# Patient Record
Sex: Male | Born: 1970 | Race: Black or African American | Hispanic: No | Marital: Single | Smoking: Former smoker
Health system: Southern US, Community
[De-identification: ages and names within clinical notes are randomized; demographics above are authoritative.]

---

## 2013-04-17 ENCOUNTER — Emergency Department (HOSPITAL_COMMUNITY)
Admission: EM | Admit: 2013-04-17 | Discharge: 2013-04-17 | Disposition: A | Discharge status: Home / Self Care | Payer: Self-pay | Attending: Emergency Medicine | Admitting: Emergency Medicine

## 2013-04-17 ENCOUNTER — Encounter (HOSPITAL_COMMUNITY): Payer: Self-pay | Admitting: Emergency Medicine

## 2013-04-17 DIAGNOSIS — S335XXA Sprain of ligaments of lumbar spine, initial encounter: Secondary | ICD-10-CM | POA: Insufficient documentation

## 2013-04-17 DIAGNOSIS — Y939 Activity, unspecified: Secondary | ICD-10-CM | POA: Insufficient documentation

## 2013-04-17 DIAGNOSIS — T148XXA Other injury of unspecified body region, initial encounter: Secondary | ICD-10-CM

## 2013-04-17 DIAGNOSIS — W010XXA Fall on same level from slipping, tripping and stumbling without subsequent striking against object, initial encounter: Secondary | ICD-10-CM | POA: Insufficient documentation

## 2013-04-17 DIAGNOSIS — M545 Low back pain, unspecified: Secondary | ICD-10-CM

## 2013-04-17 DIAGNOSIS — X500XXA Overexertion from strenuous movement or load, initial encounter: Secondary | ICD-10-CM | POA: Insufficient documentation

## 2013-04-17 DIAGNOSIS — Y990 Civilian activity done for income or pay: Secondary | ICD-10-CM | POA: Insufficient documentation

## 2013-04-17 DIAGNOSIS — Y929 Unspecified place or not applicable: Secondary | ICD-10-CM | POA: Insufficient documentation

## 2013-04-17 MED ORDER — DIAZEPAM 5 MG PO TABS: 5.0000 mg | ORAL_TABLET | Freq: Two times a day (BID) | ORAL | Status: AC

## 2013-04-17 MED ORDER — IBUPROFEN 800 MG PO TABS
800.0000 mg | ORAL_TABLET | Freq: Once | ORAL | Status: AC
  Administered 2013-04-17: 800 mg via ORAL
  Filled 2013-04-17: qty 1

## 2013-04-17 MED ORDER — IBUPROFEN 800 MG PO TABS: 800.0000 mg | ORAL_TABLET | Freq: Three times a day (TID) | ORAL | Status: DC

## 2013-04-17 NOTE — Discharge Instructions (Signed)
Lumbosacral Strain Lumbosacral strain is a strain of any of the parts that make up your lumbosacral vertebrae. Your lumbosacral vertebrae are the bones that make up the lower third of your backbone. Your lumbosacral vertebrae are held together by muscles and tough, fibrous tissue (ligaments).  CAUSES  A sudden blow to your back can cause lumbosacral strain. Also, anything that causes an excessive stretch of the muscles in the low back can cause this strain. This is typically seen when people exert themselves strenuously, fall, lift heavy objects, bend, or crouch repeatedly. RISK FACTORS  Physically demanding work.  Participation in pushing or pulling sports or sports that require sudden twist of the back (tennis, golf, baseball).  Weight lifting.  Excessive lower back curvature.  Forward-tilted pelvis.  Weak back or abdominal muscles or both.  Tight hamstrings. SIGNS AND SYMPTOMS  Lumbosacral strain may cause pain in the area of your injury or pain that moves (radiates) down your leg.  DIAGNOSIS Your health care provider can often diagnose lumbosacral strain through a physical exam. In some cases, you may need tests such as X-ray exams.  TREATMENT  Treatment for your lower back injury depends on many factors that your clinician will have to evaluate. However, most treatment will include the use of anti-inflammatory medicines. HOME CARE INSTRUCTIONS   Avoid hard physical activities (tennis, racquetball, waterskiing) if you are not in proper physical condition for it. This may aggravate or create problems.  If you have a back problem, avoid sports requiring sudden body movements. Swimming and walking are generally safer activities.  Maintain good posture.  Maintain a healthy weight.  For acute conditions, you may put ice on the injured area.  Put ice in a plastic bag.  Place a towel between your skin and the bag.  Leave the ice on for 20 minutes, 2 3 times a day.  When the  low back starts healing, stretching and strengthening exercises may be recommended. SEEK MEDICAL CARE IF:  Your back pain is getting worse.  You experience severe back pain not relieved with medicines. SEEK IMMEDIATE MEDICAL CARE IF:   You have numbness, tingling, weakness, or problems with the use of your arms or legs.  There is a change in bowel or bladder control.  You have increasing pain in any area of the body, including your belly (abdomen).  You notice shortness of breath, dizziness, or feel faint.  You feel sick to your stomach (nauseous), are throwing up (vomiting), or become sweaty.  You notice discoloration of your toes or legs, or your feet get very cold. MAKE SURE YOU:   Understand these instructions.  Will watch your condition.  Will get help right away if you are not doing well or get worse. Document Released: 11/22/2004 Document Revised: 12/03/2012 Document Reviewed: 10/01/2012 [employer] Patient Information 2014 [employer].  

## 2013-04-17 NOTE — ED Provider Notes (Signed)
Medical screening examination/treatment/procedure(s) were performed by non-physician practitioner and as supervising physician I was immediately available for consultation/collaboration.  EKG Interpretation   None         Mihailo Sage W. Shikha Bibb, MD 04/17/13 0757 

## 2013-04-17 NOTE — ED Provider Notes (Signed)
CSN: [id number]     Arrival date & time 04/17/13  0704 History   First MD Initiated Contact with Patient 04/17/13 [phone]     No chief complaint on file.    (Consider location/radiation/quality/duration/timing/severity/associated sxs/prior Treatment) Patient is a 43 y.o. male presenting with back pain. The history is provided by the patient. No language interpreter was used.  Back Pain Associated symptoms: no fever and no numbness     43 year old male presents complaining of back pain. Patient reports 2 days ago he slipped on ice and almost fell. At that time he developed an acute onset of sharp pain to his left low back. Pain was initially mild, nonradiating, and he was able to walk without difficulty. The next day he noticed increasing pain to the same location. Pain is worsened with sitting, laying on the affected side and improves with walking. Pain is keeping him up at night. No specific treatment tried. No complaints of fever, rash, urinary or bowel incontinence, saddle anesthesia, or rash. No numbness or weakness. No history of chronic back pain. No abd pain, no dysuria, hematuria, no history of kidney stone. No history of IV drug use, or cancer.  No past medical history on file. No past surgical history on file. No family history on file. History  Substance Use Topics  . Smoking status: Not on file  . Smokeless tobacco: Not on file  . Alcohol Use: Not on file    Review of Systems  Constitutional: Negative for fever.  Musculoskeletal: Positive for back pain.  Neurological: Negative for numbness.      Allergies  Review of patient's allergies indicates not on file.  Home Medications  No current outpatient prescriptions on file. BP 122/65  Pulse 60  Temp(Src) 97.5 F (36.4 C) (Oral)  Resp 16  SpO2 100% Physical Exam  Constitutional: He appears well-developed and well-nourished. No distress.  HENT:  Head: Atraumatic.  Eyes: Conjunctivae are normal.  Neck: Normal range  of motion. Neck supple.  Cardiovascular: Intact distal pulses.   Abdominal: Soft. There is no tenderness.  Genitourinary:  No CVA tenderness  Musculoskeletal: He exhibits tenderness (tenderness to left paralumbar spine worsening with flexion, and rotation. Normal straight leg raise. No overlying skin changes.). He exhibits no edema.  Neurological: He is alert. He has normal reflexes.  Skin: No rash noted.  Psychiatric: He has a normal mood and affect.    ED Course  Procedures (including critical care time)  Patient here with left paralumbar pain likely musculoskeletal in origin as pain is reproducible with movement. No red flags. Low suspicion for kidney stone, caudal equina, or sciatica. Do not think advance imaging necessary as no specific trauma.  I did discuss option of obtaining xray, pt declined, doesn't think he broken any bones. Will treat symptoms with nonsteroidal anti-inflammatory medication, and muscle relaxant. Orthopedic referral as needed. Strict return precautions given. Patient otherwise afebrile with stable normal vital signs. Patient able to ambulate  Labs Review Labs Reviewed - No data to display Imaging Review No results found.  EKG Interpretation   None       MDM   Final diagnoses:  Low back pain  Muscle strain    BP 122/65  Pulse 60  Temp(Src) 97.5 F (36.4 C) (Oral)  Resp 16  Ht 6\' 4"  (1.93 m)  Wt 195 lb (88.451 kg)  BMI 23.75 kg/m2  SpO2 100%     Fayrene HelperBowie Brandilynn Taormina, PA-C 04/17/13 [phone]

## 2013-04-17 NOTE — ED Notes (Signed)
PA Bowie at bedside  °

## 2013-04-17 NOTE — ED Notes (Signed)
Pt presents via POV from home with c/o of lower back pain.  Pt states 2 days ago he slipped at work on the ice and caught his self by turning awkward and injuring his back.

## 2013-11-09 ENCOUNTER — Encounter (HOSPITAL_COMMUNITY): Payer: Self-pay | Admitting: Emergency Medicine

## 2013-11-09 ENCOUNTER — Emergency Department (HOSPITAL_COMMUNITY)
Admission: EM | Admit: 2013-11-09 | Discharge: 2013-11-09 | Disposition: A | Discharge status: Home / Self Care | Payer: Self-pay | Attending: Emergency Medicine | Admitting: Emergency Medicine

## 2013-11-09 DIAGNOSIS — Z791 Long term (current) use of non-steroidal anti-inflammatories (NSAID): Secondary | ICD-10-CM | POA: Insufficient documentation

## 2013-11-09 DIAGNOSIS — Z202 Contact with and (suspected) exposure to infections with a predominantly sexual mode of transmission: Secondary | ICD-10-CM | POA: Insufficient documentation

## 2013-11-09 DIAGNOSIS — Z79899 Other long term (current) drug therapy: Secondary | ICD-10-CM | POA: Insufficient documentation

## 2013-11-09 DIAGNOSIS — R369 Urethral discharge, unspecified: Secondary | ICD-10-CM | POA: Insufficient documentation

## 2013-11-09 DIAGNOSIS — F17200 Nicotine dependence, unspecified, uncomplicated: Secondary | ICD-10-CM | POA: Insufficient documentation

## 2013-11-09 LAB — URINALYSIS, ROUTINE W REFLEX MICROSCOPIC
BILIRUBIN URINE: NEGATIVE
Glucose, UA: NEGATIVE mg/dL
KETONES UR: NEGATIVE mg/dL
NITRITE: NEGATIVE
PH: 6.5 (ref 5.0–8.0)
Protein, ur: NEGATIVE mg/dL
Specific Gravity, Urine: 1.012 (ref 1.005–1.030)
Urobilinogen, UA: 0.2 mg/dL (ref 0.0–1.0)

## 2013-11-09 LAB — URINE MICROSCOPIC-ADD ON

## 2013-11-09 MED ORDER — METRONIDAZOLE 500 MG PO TABS: 500.0000 mg | ORAL_TABLET | Freq: Two times a day (BID) | ORAL | Status: DC

## 2013-11-09 MED ORDER — CEFTRIAXONE SODIUM 250 MG IJ SOLR
250.0000 mg | Freq: Once | INTRAMUSCULAR | Status: AC
  Administered 2013-11-09: 250 mg via INTRAMUSCULAR
  Filled 2013-11-09: qty 250

## 2013-11-09 MED ORDER — AZITHROMYCIN 250 MG PO TABS
1000.0000 mg | ORAL_TABLET | Freq: Once | ORAL | Status: AC
  Administered 2013-11-09: 1000 mg via ORAL
  Filled 2013-11-09: qty 4

## 2013-11-09 NOTE — ED Notes (Signed)
Patient states his friend told him she had "trich" and that he needed to come get tested.

## 2013-11-09 NOTE — ED Provider Notes (Signed)
Medical screening examination/treatment/procedure(s) were performed by non-physician practitioner and as supervising physician I was immediately available for consultation/collaboration.  Kathya Wilz, MD 11/09/13 1607 

## 2013-11-09 NOTE — Discharge Instructions (Signed)
Take Flagyl twice daily for 7 days for the exposure to trichomonas. You were treated today for both gonorrhea and chlamydia. If these tests result positive, you will be contacted and are obligated to inform your partner. Trichomoniasis Trichomoniasis is an infection caused by an organism called Trichomonas. The infection can affect both women and men. In women, the outer male genitalia and the vagina are affected. In men, the penis is mainly affected, but the prostate and other reproductive organs can also be involved. Trichomoniasis is a sexually transmitted infection (STI) and is most often passed to another person through sexual contact.  RISK FACTORS  Having unprotected sexual intercourse.  Having sexual intercourse with an infected partner. SIGNS AND SYMPTOMS  Symptoms of trichomoniasis in women include:  Abnormal gray-green frothy vaginal discharge.  Itching and irritation of the vagina.  Itching and irritation of the area outside the vagina. Symptoms of trichomoniasis in men include:   Penile discharge with or without pain.  Pain during urination. This results from inflammation of the urethra. DIAGNOSIS  Trichomoniasis may be found during a Pap test or physical exam. Your health care provider may use one of the following methods to help diagnose this infection:  Examining vaginal discharge under a microscope. For men, urethral discharge would be examined.  Testing the pH of the vagina with a test tape.  Using a vaginal swab test that checks for the Trichomonas organism. A test is available that provides results within a few minutes.  Doing a culture test for the organism. This is not usually needed. TREATMENT   You may be given medicine to fight the infection. Women should inform their health care provider if they could be or are pregnant. Some medicines used to treat the infection should not be taken during pregnancy.  Your health care provider may recommend  over-the-counter medicines or creams to decrease itching or irritation.  Your sexual partner will need to be treated if infected. HOME CARE INSTRUCTIONS   Take medicines only as directed by your health care provider.  Take over-the-counter medicine for itching or irritation as directed by your health care provider.  Do not have sexual intercourse while you have the infection.  Women should not douche or wear tampons while they have the infection.  Discuss your infection with your partner. Your partner may have gotten the infection from you, or you may have gotten it from your partner.  Have your sex partner get examined and treated if necessary.  Practice safe, informed, and protected sex.  See your health care provider for other STI testing. SEEK MEDICAL CARE IF:   You still have symptoms after you finish your medicine.  You develop abdominal pain.  You have pain when you urinate.  You have bleeding after sexual intercourse.  You develop a rash.  Your medicine makes you sick or makes you throw up (vomit). MAKE SURE YOU:  Understand these instructions.  Will watch your condition.  Will get help right away if you are not doing well or get worse. Document Released: 08/08/2000 Document Revised: 06/29/2013 Document Reviewed: 11/24/2012 [facility] Patient Information 2015 [place], [place]. This information is not intended to replace advice given to you by your health care provider. Make sure you discuss any questions you have with your health care provider. Sexually Transmitted Disease A sexually transmitted disease (STD) is a disease or infection that may be passed (transmitted) from person to person, usually during sexual activity. This may happen by way of saliva, semen, blood, vaginal mucus,  or urine. Common STDs include:   Gonorrhea.   Chlamydia.   Syphilis.   HIV and AIDS.   Genital herpes.   Hepatitis B and C.   Trichomonas.   Human papillomavirus  (HPV).   Pubic lice.   Scabies.  Mites.  Bacterial vaginosis. WHAT ARE CAUSES OF STDs? An STD may be caused by bacteria, a virus, or parasites. STDs are often transmitted during sexual activity if one person is infected. However, they may also be transmitted through nonsexual means. STDs may be transmitted after:   Sexual intercourse with an infected person.   Sharing sex toys with an infected person.   Sharing needles with an infected person or using unclean piercing or tattoo needles.  Having intimate contact with the genitals, mouth, or rectal areas of an infected person.   Exposure to infected fluids during birth. WHAT ARE THE SIGNS AND SYMPTOMS OF STDs? Different STDs have different symptoms. Some people may not have any symptoms. If symptoms are present, they may include:   Painful or bloody urination.   Pain in the pelvis, abdomen, vagina, anus, throat, or eyes.   A skin rash, itching, or irritation.  Growths, ulcerations, blisters, or sores in the genital and anal areas.  Abnormal vaginal discharge with or without bad odor.   Penile discharge in men.   Fever.   Pain or bleeding during sexual intercourse.   Swollen glands in the groin area.   Yellow skin and eyes (jaundice). This is seen with hepatitis.   Swollen testicles.  Infertility.  Sores and blisters in the mouth. HOW ARE STDs DIAGNOSED? To make a diagnosis, your health care provider may:   Take a medical history.   Perform a physical exam.   Take a sample of any discharge to examine.  Swab the throat, cervix, opening to the penis, rectum, or vagina for testing.  Test a sample of your first morning urine.   Perform blood tests.   Perform a Pap test, if this applies.   Perform a colposcopy.   Perform a laparoscopy.  HOW ARE STDs TREATED? Treatment depends on the STD. Some STDs may be treated but not cured.   Chlamydia, gonorrhea, trichomonas, and syphilis can be  cured with antibiotic medicine.   Genital herpes, hepatitis, and HIV can be treated, but not cured, with prescribed medicines. The medicines lessen symptoms.   Genital warts from HPV can be treated with medicine or by freezing, burning (electrocautery), or surgery. Warts may come back.   HPV cannot be cured with medicine or surgery. However, abnormal areas may be removed from the cervix, vagina, or vulva.   If your diagnosis is confirmed, your recent sexual partners need treatment. This is true even if they are symptom-free or have a negative culture or evaluation. They should not have sex until their health care providers say it is okay. HOW CAN I REDUCE MY RISK OF GETTING AN STD? Take these steps to reduce your risk of getting an STD:  Use latex condoms, dental dams, and water-soluble lubricants during sexual activity. Do not use petroleum jelly or oils.  Avoid having multiple sex partners.  Do not have sex with someone who has other sex partners.  Do not have sex with anyone you do not know or who is at high risk for an STD.  Avoid risky sex practices that can break your skin.  Do not have sex if you have open sores on your mouth or skin.  Avoid drinking too much alcohol  or taking illegal drugs. Alcohol and drugs can affect your judgment and put you in a vulnerable position.  Avoid engaging in oral and anal sex acts.  Get vaccinated for HPV and hepatitis. If you have not received these vaccines in the past, talk to your health care provider about whether one or both might be right for you.   If you are at risk of being infected with HIV, it is recommended that you take a prescription medicine daily to prevent HIV infection. This is called pre-exposure prophylaxis (PrEP). You are considered at risk if:  You are a man who has sex with other men (MSM).  You are a heterosexual man or woman and are sexually active with more than one partner.  You take drugs by injection.  You  are sexually active with a partner who has HIV.  Talk with your health care provider about whether you are at high risk of being infected with HIV. If you choose to begin PrEP, you should first be tested for HIV. You should then be tested every 3 months for as long as you are taking PrEP.  WHAT SHOULD I DO IF I THINK I HAVE AN STD?  See your health care provider.   Tell your sexual partner(s). They should be tested and treated for any STDs.  Do not have sex until your health care provider says it is okay. WHEN SHOULD I GET IMMEDIATE MEDICAL CARE? Contact your health care provider right away if:   You have severe abdominal pain.  You are a man and notice swelling or pain in your testicles.  You are a woman and notice swelling or pain in your vagina. Document Released: 05/05/2002 Document Revised: 02/17/2013 Document Reviewed: 09/02/2012 [facility] Patient Information 2015 [place], [place]. This information is not intended to replace advice given to you by your health care provider. Make sure you discuss any questions you have with your health care provider.

## 2013-11-09 NOTE — ED Provider Notes (Signed)
CSN: [id number]     Arrival date & time 11/09/13  4098 History   First MD Initiated Contact with Patient 11/09/13 0818     Chief Complaint  Patient presents with  . Exposure to STD     (Consider location/radiation/quality/duration/timing/severity/associated sxs/prior Treatment) HPI Comments: Pt is a 43 y/o male who presents to the ED for STD testing and treatment. States he was exposed to "trich". Admits to being sexually active with one partner and does not use protection. Denies abdominal pain, n/v, fever, chills, penile pain or discharge, testicular pain or swelling.  Patient is a 43 y.o. male presenting with STD exposure. The history is provided by the patient.  Exposure to STD    History reviewed. No pertinent past medical history. History reviewed. No pertinent past surgical history. No family history on file. History  Substance Use Topics  . Smoking status: Current Some Day Smoker    Types: Cigars  . Smokeless tobacco: Never Used  . Alcohol Use: No    Review of Systems  Constitutional: Negative.   Genitourinary: Negative.   All other systems reviewed and are negative.     Allergies  Review of patient's allergies indicates no known allergies.  Home Medications   Prior to Admission medications   Medication Sig Start Date End Date Taking? Authorizing Provider  diazepam (VALIUM) 5 MG tablet Take 1 tablet (5 mg total) by mouth 2 (two) times daily. 04/17/13   Fayrene Helper, PA-C  ibuprofen (ADVIL,MOTRIN) 800 MG tablet Take 1 tablet (800 mg total) by mouth 3 (three) times daily. 04/17/13   Fayrene Helper, PA-C  metroNIDAZOLE (FLAGYL) 500 MG tablet Take 1 tablet (500 mg total) by mouth 2 (two) times daily. One po bid x 7 days 11/09/13   Trevor Mace, PA-C   BP 148/80  Pulse 52  Temp(Src) 97.6 F (36.4 C) (Oral)  Resp 16  Ht  (1.93 m)  Wt 200 lb (90.719 kg)  BMI 24.35 kg/m2  SpO2 100% Physical Exam  Nursing note and vitals reviewed. Constitutional: He is oriented  to person, place, and time. He appears well-developed and well-nourished. No distress.  HENT:  Head: Normocephalic and atraumatic.  Mouth/Throat: Oropharynx is clear and moist.  Eyes: Conjunctivae are normal.  Neck: Normal range of motion. Neck supple.  Cardiovascular: Normal rate, regular rhythm and normal heart sounds.   Pulmonary/Chest: Effort normal and breath sounds normal.  Abdominal: Soft. Bowel sounds are normal. There is no tenderness.  Genitourinary: Right testis shows no mass, no swelling and no tenderness. Left testis shows no mass, no swelling and no tenderness. Circumcised. No penile erythema or penile tenderness. Discharge (small amount of clear discharge) found.  Musculoskeletal: Normal range of motion. He exhibits no edema.  Neurological: He is alert and oriented to person, place, and time.  Skin: Skin is warm and dry. He is not diaphoretic.  Psychiatric: He has a normal mood and affect. His behavior is normal.    ED Course  Procedures (including critical care time) Labs Review Labs Reviewed  GC/CHLAMYDIA PROBE AMP  URINALYSIS, ROUTINE W REFLEX MICROSCOPIC    Imaging Review No results found.   EKG Interpretation None      MDM   Final diagnoses:  Exposure to STD   C/Chlamydia cultures pending. UA obtained. Prophylactic treatment with rocephin, azithromycin and flagyl. Safe sexual practices discussed. Stable for d/c.Return precautions given. Patient states understanding of treatment care plan and is agreeable.  Trevor Mace, PA-C 11/09/13 [phone]

## 2013-11-10 LAB — GC/CHLAMYDIA PROBE AMP
CT Probe RNA: POSITIVE — AB
GC Probe RNA: NEGATIVE

## 2013-11-11 ENCOUNTER — Telehealth (HOSPITAL_BASED_OUTPATIENT_CLINIC_OR_DEPARTMENT_OTHER): Payer: Self-pay | Admitting: Emergency Medicine

## 2013-11-11 NOTE — Telephone Encounter (Signed)
Positive Chlamydia Treated with Zithromax and Rocephin per protocol MD [facility] faxed  ID verified, Patient notified of positive Chlamydia and that treatment was given in ED with Rocephin and Ztihromax. STD instructions provided - patient verbalized understanding.

## 2013-11-12 ENCOUNTER — Telehealth (HOSPITAL_COMMUNITY): Payer: Self-pay

## 2013-11-24 ENCOUNTER — Emergency Department: Payer: Self-pay | Admitting: Emergency Medicine

## 2014-06-25 ENCOUNTER — Emergency Department (HOSPITAL_COMMUNITY)
Admission: EM | Admit: 2014-06-25 | Discharge: 2014-06-25 | Disposition: A | Discharge status: Home / Self Care | Payer: Self-pay | Attending: Emergency Medicine | Admitting: Emergency Medicine

## 2014-06-25 ENCOUNTER — Encounter (HOSPITAL_COMMUNITY): Payer: Self-pay | Admitting: Emergency Medicine

## 2014-06-25 DIAGNOSIS — T148XXA Other injury of unspecified body region, initial encounter: Secondary | ICD-10-CM

## 2014-06-25 DIAGNOSIS — Z79899 Other long term (current) drug therapy: Secondary | ICD-10-CM | POA: Insufficient documentation

## 2014-06-25 DIAGNOSIS — Z720 Tobacco use: Secondary | ICD-10-CM | POA: Insufficient documentation

## 2014-06-25 DIAGNOSIS — Y9289 Other specified places as the place of occurrence of the external cause: Secondary | ICD-10-CM | POA: Insufficient documentation

## 2014-06-25 DIAGNOSIS — S4992XA Unspecified injury of left shoulder and upper arm, initial encounter: Secondary | ICD-10-CM | POA: Insufficient documentation

## 2014-06-25 DIAGNOSIS — Z791 Long term (current) use of non-steroidal anti-inflammatories (NSAID): Secondary | ICD-10-CM | POA: Insufficient documentation

## 2014-06-25 DIAGNOSIS — Y9389 Activity, other specified: Secondary | ICD-10-CM | POA: Insufficient documentation

## 2014-06-25 DIAGNOSIS — T148 Other injury of unspecified body region: Secondary | ICD-10-CM | POA: Insufficient documentation

## 2014-06-25 DIAGNOSIS — Y998 Other external cause status: Secondary | ICD-10-CM | POA: Insufficient documentation

## 2014-06-25 DIAGNOSIS — X58XXXA Exposure to other specified factors, initial encounter: Secondary | ICD-10-CM | POA: Insufficient documentation

## 2014-06-25 MED ORDER — IBUPROFEN 800 MG PO TABS: 800.0000 mg | ORAL_TABLET | Freq: Three times a day (TID) | ORAL | Status: AC

## 2014-06-25 MED ORDER — CYCLOBENZAPRINE HCL 10 MG PO TABS: 10.0000 mg | ORAL_TABLET | Freq: Two times a day (BID) | ORAL | Status: AC | PRN

## 2014-06-25 NOTE — ED Provider Notes (Signed)
CSN: [id number]     Arrival date & time 06/25/14  1136 History  This chart was scribed for non-physician practitioner, Teressa Lower, NP, working with Mancel Bale, MD by Charline Bills, ED Scribe. This patient was seen in room TR06C/TR06C and the patient's care was started at 11:52 AM.   Chief Complaint  Patient presents with  . Back Pain  . Shoulder Pain   The history is provided by the patient. No language interpreter was used.   HPI Comments: Jeffrey Curry is a 44 y.o. male, with no pertinent medical history, who presents to the Emergency Department complaining of acute onset of L shoulder pain that radiates into upper back since yesterday. Pt states that he did a lot of heavy lifting in a basement yesterday. He describes pain as soreness and muscle spasms. Pt denies numbness, weakness, h/o back pain. Pt has been treating with ibuprofen and ice.   History reviewed. No pertinent past medical history. History reviewed. No pertinent past surgical history. No family history on file. History  Substance Use Topics  . Smoking status: Current Some Day Smoker    Types: Cigars  . Smokeless tobacco: Never Used  . Alcohol Use: No    Review of Systems  Musculoskeletal: Positive for back pain and arthralgias.  Neurological: Negative for weakness and numbness.  All other systems reviewed and are negative.  Allergies  Review of patient's allergies indicates no known allergies.  Home Medications   Prior to Admission medications   Medication Sig Start Date End Date Taking? Authorizing Provider  diazepam (VALIUM) 5 MG tablet Take 1 tablet (5 mg total) by mouth 2 (two) times daily. 04/17/13   Fayrene Helper, PA-C  ibuprofen (ADVIL,MOTRIN) 800 MG tablet Take 1 tablet (800 mg total) by mouth 3 (three) times daily. 04/17/13   Fayrene Helper, PA-C  metroNIDAZOLE (FLAGYL) 500 MG tablet Take 1 tablet (500 mg total) by mouth 2 (two) times daily. One po bid x 7 days 11/09/13   Robyn M Hess, PA-C   BP 130/82  mmHg  Pulse 58  Temp(Src) 97.7 F (36.5 C) (Oral)  Resp 20  SpO2 100% Physical Exam  Constitutional: He is oriented to person, place, and time. He appears well-developed and well-nourished. No distress.  HENT:  Head: Normocephalic and atraumatic.  Eyes: Conjunctivae and EOM are normal.  Neck: Neck supple. No tracheal deviation present.  Cardiovascular: Normal rate.   Pulmonary/Chest: Effort normal. No respiratory distress.  Musculoskeletal: Normal range of motion.       Arms: Neurological: He is alert and oriented to person, place, and time. He exhibits normal muscle tone. Coordination normal.  Skin: Skin is warm and dry.  Psychiatric: He has a normal mood and affect. His behavior is normal.  Nursing note and vitals reviewed.  ED Course  Procedures (including critical care time) DIAGNOSTIC STUDIES: Oxygen Saturation is 100% on RA, normal by my interpretation.    COORDINATION OF CARE: 11:54 AM-Discussed treatment plan which includes Flexeril and continue ibuprofen with pt at bedside and pt agreed to plan.   Labs Review Labs Reviewed - No data to display  Imaging Review No results found.   EKG Interpretation None      MDM   Final diagnoses:  Muscle strain   Doubt bony injury. Likely muscular. Pt is okay to follow up with ortho as needed  I personally performed the services described in this documentation, which was scribed in my presence. The recorded information has been reviewed and is accurate.  Teressa LowerVrinda Sarahanne Novakowski, NP 06/25/14 1217  Mancel BaleElliott Wentz, MD 06/25/14 [phone]

## 2014-06-25 NOTE — Discharge Instructions (Signed)

## 2014-06-25 NOTE — ED Notes (Addendum)
Pt c/o left mid to upper back pain and left shoulder pain that started yesterday. Pt does a lot of heavy lifting. Pain increases when reaching or moving left arm.

## 2014-09-08 ENCOUNTER — Emergency Department (HOSPITAL_COMMUNITY)
Admission: EM | Admit: 2014-09-08 | Discharge: 2014-09-08 | Disposition: A | Discharge status: Home / Self Care | Payer: Self-pay | Attending: Emergency Medicine | Admitting: Emergency Medicine

## 2014-09-08 ENCOUNTER — Encounter (HOSPITAL_COMMUNITY): Payer: Self-pay | Admitting: *Deleted

## 2014-09-08 DIAGNOSIS — Y9289 Other specified places as the place of occurrence of the external cause: Secondary | ICD-10-CM | POA: Insufficient documentation

## 2014-09-08 DIAGNOSIS — W57XXXA Bitten or stung by nonvenomous insect and other nonvenomous arthropods, initial encounter: Secondary | ICD-10-CM | POA: Insufficient documentation

## 2014-09-08 DIAGNOSIS — Z720 Tobacco use: Secondary | ICD-10-CM | POA: Insufficient documentation

## 2014-09-08 DIAGNOSIS — Z791 Long term (current) use of non-steroidal anti-inflammatories (NSAID): Secondary | ICD-10-CM | POA: Insufficient documentation

## 2014-09-08 DIAGNOSIS — Y998 Other external cause status: Secondary | ICD-10-CM | POA: Insufficient documentation

## 2014-09-08 DIAGNOSIS — Y9389 Activity, other specified: Secondary | ICD-10-CM | POA: Insufficient documentation

## 2014-09-08 DIAGNOSIS — Z79899 Other long term (current) drug therapy: Secondary | ICD-10-CM | POA: Insufficient documentation

## 2014-09-08 DIAGNOSIS — S50861A Insect bite (nonvenomous) of right forearm, initial encounter: Secondary | ICD-10-CM | POA: Insufficient documentation

## 2014-09-08 DIAGNOSIS — L03113 Cellulitis of right upper limb: Secondary | ICD-10-CM | POA: Insufficient documentation

## 2014-09-08 MED ORDER — CEPHALEXIN 500 MG PO CAPS: 500.0000 mg | ORAL_CAPSULE | Freq: Four times a day (QID) | ORAL | Status: DC

## 2014-09-08 NOTE — ED Notes (Signed)
Pt reports possible insect bite to right forearm, has red area noted to anterior forearm. No red streaks noted, denies fever.

## 2014-09-08 NOTE — ED Provider Notes (Signed)
CSN: [id number]     Arrival date & time 09/08/14  0903 History  This chart was scribed for non-physician practitioner, Lottie Mussel, PA-C, working with Purvis Sheffield, MD by Charline Bills, ED Scribe. This patient was seen in room TR09C/TR09C and the patient's care was started at 9:25 AM.   Chief Complaint  Patient presents with  . Abscess   The history is provided by the patient. No language interpreter was used.   HPI Comments: Jeffrey Curry is a 44 y.o. male who presents to the Emergency Department complaining of an insect bite to right forearm 2 days ago. Pt suspects that he was stung by a wasp although he did not see the insect. He reports associated gradually worsening swelling, itching and redness. Pt denies pain to the affected area. No treatments tried PTA. No fever or chills  History reviewed. No pertinent past medical history. History reviewed. No pertinent past surgical history. History reviewed. No pertinent family history. History  Substance Use Topics  . Smoking status: Current Some Day Smoker    Types: Cigars  . Smokeless tobacco: Never Used  . Alcohol Use: No    Review of Systems  Constitutional: Negative for fever and chills.  Skin: Positive for color change.       + Bite  Neurological: Negative for headaches.   Allergies  Review of patient's allergies indicates no known allergies.  Home Medications   Prior to Admission medications   Medication Sig Start Date End Date Taking? Authorizing Provider  cyclobenzaprine (FLEXERIL) 10 MG tablet Take 1 tablet (10 mg total) by mouth 2 (two) times daily as needed for muscle spasms. 06/25/14   Teressa Lower, NP  diazepam (VALIUM) 5 MG tablet Take 1 tablet (5 mg total) by mouth 2 (two) times daily. 04/17/13   Fayrene Helper, PA-C  ibuprofen (ADVIL,MOTRIN) 800 MG tablet Take 1 tablet (800 mg total) by mouth 3 (three) times daily. 06/25/14   Teressa Lower, NP  metroNIDAZOLE (FLAGYL) 500 MG tablet Take 1 tablet (500  mg total) by mouth 2 (two) times daily. One po bid x 7 days 11/09/13   Nada Boozer Hess, PA-C   BP 122/68 mmHg  Pulse 71  Temp(Src) 97.7 F (36.5 C) (Oral)  Resp 16  Ht  (1.905 m)  Wt 210 lb (95.255 kg)  BMI 26.25 kg/m2  SpO2 100% Physical Exam  Constitutional: He is oriented to person, place, and time. He appears well-developed and well-nourished. No distress.  HENT:  Head: Normocephalic and atraumatic.  Eyes: Conjunctivae and EOM are normal.  Neck: Neck supple. No tracheal deviation present.  Cardiovascular: Normal rate and intact distal pulses.   Distal radial pulses intact  Pulmonary/Chest: Effort normal. No respiratory distress.  Musculoskeletal: Normal range of motion.  Neurological: He is alert and oriented to person, place, and time.  Skin: Skin is warm and dry.  6 x 4 cm area of erythema to the right anterior forearm with tenderness, induration, warmth to touch. No drainage. No stinger seen. No pustules or vesicles. No fluctuance to palpation.   Psychiatric: He has a normal mood and affect. His behavior is normal.  Nursing note and vitals reviewed.  ED Course  Procedures (including critical care time) DIAGNOSTIC STUDIES: Oxygen Saturation is 100% on RA, normal by my interpretation.    COORDINATION OF CARE: 9:29 AM-Discussed treatment plan which includes Keflex and hydrocortisone cream with pt at bedside and pt agreed to plan.   Labs Review Labs Reviewed - No data  to display  Imaging Review No results found.   EKG Interpretation None      MDM   Final diagnoses:  Right forearm cellulitis  Insect bite of forearm, right, initial encounter    patient with area of erythema and tenderness to the right forearm after being stung by unknown insect. Patient felt being stung but did not see what got him. He now has area of erythema that he says is spreading. Will treat with hydrocortisone cream and with Keflex for possible early cellulitis given tenderness and  spreading of erythema. Patient is afebrile, otherwise nontoxic appearing. Instructed to follow-up and return precautions discussed.    Filed Vitals:   09/08/14 0908  BP: 122/68  Pulse: 71  Temp: 97.7 F (36.5 C)  TempSrc: Oral  Resp: 16  Height: 6\' 3"  (1.905 m)  Weight: 210 lb (95.255 kg)  SpO2: 100%   I personally performed the services described in this documentation, which was scribed in my presence. The recorded information has been reviewed and is accurate.    Jaynie Crumbleatyana Toinette Lackie, PA-C 09/08/14 [id number]  Purvis SheffieldForrest Harrison, MD 09/10/14 [phone]

## 2014-09-08 NOTE — Discharge Instructions (Signed)
Apply hydrocortisone cream topically 3 times a day. Take Keflex as prescribed until all gone. Follow-up as needed. Return if worsening.   Bee, Wasp, or Hornet Sting Your caregiver has diagnosed you as having an insect sting. An insect sting appears as a red lump in the skin that sometimes has a tiny hole in the center, or it may have a stinger in the center of the wound. The most common stings are from wasps, hornets and bees. Individuals have different reactions to insect stings.  A normal reaction may cause pain, swelling, and redness around the sting site.  A localized allergic reaction may cause swelling and redness that extends beyond the sting site.  A large local reaction may continue to develop over the next 12 to 36 hours.  On occasion, the reactions can be severe (anaphylactic reaction). An anaphylactic reaction may cause wheezing; difficulty breathing; chest pain; fainting; raised, itchy, red patches on the skin; a sick feeling to your stomach (nausea); vomiting; cramping; or diarrhea. If you have had an anaphylactic reaction to an insect sting in the past, you are more likely to have one again. HOME CARE INSTRUCTIONS   With bee stings, a small sac of poison is left in the wound. Brushing across this with something such as a credit card, or anything similar, will help remove this and decrease the amount of the reaction. This same procedure will not help a wasp sting as they do not leave behind a stinger and poison sac.  Apply a cold compress for 10 to 20 minutes every hour for 1 to 2 days, depending on severity, to reduce swelling and itching.  To lessen pain, a paste made of water and baking soda may be rubbed on the bite or sting and left on for 5 minutes.  To relieve itching and swelling, you may use take medication or apply medicated creams or lotions as directed.  Only take over-the-counter or prescription medicines for pain, discomfort, or fever as directed by your  caregiver.  Wash the sting site daily with soap and water. Apply antibiotic ointment on the sting site as directed.  If you suffered a severe reaction:  If you did not require hospitalization, an adult will need to stay with you for 24 hours in case the symptoms return.  You may need to wear a medical bracelet or necklace stating the allergy.  You and your family need to learn when and how to use an anaphylaxis kit or epinephrine injection.  If you have had a severe reaction before, always carry your anaphylaxis kit with you. SEEK MEDICAL CARE IF:   None of the above helps within 2 to 3 days.  The area becomes red, warm, tender, and swollen beyond the area of the bite or sting.  You have an oral temperature above 102 F (38.9 C). SEEK IMMEDIATE MEDICAL CARE IF:  You have symptoms of an allergic reaction which are:  Wheezing.  Difficulty breathing.  Chest pain.  Lightheadedness or fainting.  Itchy, raised, red patches on the skin.  Nausea, vomiting, cramping or diarrhea. ANY OF THESE SYMPTOMS MAY REPRESENT A SERIOUS PROBLEM THAT IS AN EMERGENCY. Do not wait to see if the symptoms will go away. Get medical help right away. Call your local emergency services (911 in [place] DO NOT drive yourself to the hospital. MAKE SURE YOU:   Understand these instructions.  Will watch your condition.  Will get help right away if you are not doing well or get worse. Document  Released: 02/12/2005 Document Revised: 05/07/2011 Document Reviewed: 07/30/2009 [facility] Patient Information 2015 [place], [place]. This information is not intended to replace advice given to you by your health care provider. Make sure you discuss any questions you have with your health care provider.

## 2015-04-21 ENCOUNTER — Emergency Department (HOSPITAL_COMMUNITY)
Admission: EM | Admit: 2015-04-21 | Discharge: 2015-04-21 | Disposition: A | Discharge status: Home / Self Care | Payer: Managed Care, Other (non HMO) | Attending: Emergency Medicine | Admitting: Emergency Medicine

## 2015-04-21 ENCOUNTER — Encounter (HOSPITAL_COMMUNITY): Payer: Self-pay | Admitting: Emergency Medicine

## 2015-04-21 ENCOUNTER — Emergency Department (HOSPITAL_COMMUNITY): Payer: Managed Care, Other (non HMO)

## 2015-04-21 DIAGNOSIS — Z792 Long term (current) use of antibiotics: Secondary | ICD-10-CM | POA: Diagnosis not present

## 2015-04-21 DIAGNOSIS — Y92410 Unspecified street and highway as the place of occurrence of the external cause: Secondary | ICD-10-CM | POA: Diagnosis not present

## 2015-04-21 DIAGNOSIS — Y9389 Activity, other specified: Secondary | ICD-10-CM | POA: Insufficient documentation

## 2015-04-21 DIAGNOSIS — S4992XA Unspecified injury of left shoulder and upper arm, initial encounter: Secondary | ICD-10-CM | POA: Diagnosis present

## 2015-04-21 DIAGNOSIS — Z87891 Personal history of nicotine dependence: Secondary | ICD-10-CM | POA: Insufficient documentation

## 2015-04-21 DIAGNOSIS — S199XXA Unspecified injury of neck, initial encounter: Secondary | ICD-10-CM | POA: Diagnosis not present

## 2015-04-21 DIAGNOSIS — Z791 Long term (current) use of non-steroidal anti-inflammatories (NSAID): Secondary | ICD-10-CM | POA: Diagnosis not present

## 2015-04-21 DIAGNOSIS — Y998 Other external cause status: Secondary | ICD-10-CM | POA: Diagnosis not present

## 2015-04-21 DIAGNOSIS — Z79899 Other long term (current) drug therapy: Secondary | ICD-10-CM | POA: Diagnosis not present

## 2015-04-21 DIAGNOSIS — M898X1 Other specified disorders of bone, shoulder: Secondary | ICD-10-CM

## 2015-04-21 MED ORDER — NAPROXEN 500 MG PO TABS: 500.0000 mg | ORAL_TABLET | Freq: Two times a day (BID) | ORAL | Status: AC

## 2015-04-21 MED ORDER — METHOCARBAMOL 500 MG PO TABS: 500.0000 mg | ORAL_TABLET | Freq: Two times a day (BID) | ORAL | Status: AC
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
## 2015-04-21 NOTE — ED Notes (Signed)
Patient able to ambulate independently  
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
## 2015-09-22 ENCOUNTER — Encounter (HOSPITAL_COMMUNITY): Payer: Self-pay | Admitting: *Deleted

## 2015-09-22 ENCOUNTER — Emergency Department (HOSPITAL_COMMUNITY)
Admission: EM | Admit: 2015-09-22 | Discharge: 2015-09-22 | Disposition: A | Discharge status: Home / Self Care | Payer: Managed Care, Other (non HMO) | Attending: Emergency Medicine | Admitting: Emergency Medicine

## 2015-09-22 DIAGNOSIS — S0086XA Insect bite (nonvenomous) of other part of head, initial encounter: Secondary | ICD-10-CM | POA: Insufficient documentation

## 2015-09-22 DIAGNOSIS — Y999 Unspecified external cause status: Secondary | ICD-10-CM | POA: Insufficient documentation

## 2015-09-22 DIAGNOSIS — W57XXXA Bitten or stung by nonvenomous insect and other nonvenomous arthropods, initial encounter: Secondary | ICD-10-CM

## 2015-09-22 DIAGNOSIS — Y929 Unspecified place or not applicable: Secondary | ICD-10-CM | POA: Insufficient documentation

## 2015-09-22 DIAGNOSIS — Y939 Activity, unspecified: Secondary | ICD-10-CM | POA: Insufficient documentation

## 2015-09-22 DIAGNOSIS — Z87891 Personal history of nicotine dependence: Secondary | ICD-10-CM | POA: Insufficient documentation

## 2015-09-22 MED ORDER — PREDNISONE 20 MG PO TABS: ORAL_TABLET | ORAL | 0 refills | Status: AC

## 2015-09-22 MED ORDER — DIPHENHYDRAMINE HCL 25 MG PO CAPS
25.0000 mg | ORAL_CAPSULE | Freq: Four times a day (QID) | ORAL | 0 refills | Status: AC | PRN
Start: 2015-09-22 — End: ?

## 2015-09-22 MED ORDER — DIPHENHYDRAMINE HCL 25 MG PO CAPS
25.0000 mg | ORAL_CAPSULE | Freq: Once | ORAL | Status: AC
  Administered 2015-09-22: 25 mg via ORAL
  Filled 2015-09-22: qty 1

## 2015-09-22 MED ORDER — PREDNISONE 20 MG PO TABS
60.0000 mg | ORAL_TABLET | Freq: Once | ORAL | Status: AC
  Administered 2015-09-22: 60 mg via ORAL
  Filled 2015-09-22: qty 3

## 2015-09-22 NOTE — Discharge Instructions (Signed)
Use Prednisone as directed. Take Benadryl as needed for itching or swelling. Follow up with your doctor if symptoms are not improving after 3-4 days. You may return to the emergency department if symptoms worsen, become progressive, or become more concerning.

## 2015-09-22 NOTE — ED Provider Notes (Signed)
[facility]-EMERGENCY DEPT Provider Note   CSN: [id number] Arrival date & time: 09/22/15  2035  First Provider Contact:  09/22/2015 7:19 PM  By signing my name below, I, Levon Hedger, attest that this documentation has been prepared under the direction and in the presence of non-physician practitioner, Shanna Cisco, PA-C. Electronically Signed: Levon Hedger, Scribe. 09/22/2015. 6:34 PM.   History   Chief Complaint Chief Complaint  Patient presents with  . Insect Bite    HPI Jeffrey Curry is an otherwise healthy 45 y.o. male who presents to the Emergency Department complaining of sudden onset itching to right cheek s/p insect bite four hours ago. Pt states he was stung by a wasp. No medication taken PTA. No alleviating factors noted. He also complains of associated mild swelling. He denies visual changes, trouble breathing, trouble swallowing, or throat/neck swelling.   The history is provided by the patient. No language interpreter was used.    Home Medications    Prior to Admission medications   Medication Sig Start Date End Date Taking? Authorizing Provider  cephALEXin (KEFLEX) 500 MG capsule Take 1 capsule (500 mg total) by mouth 4 (four) times daily. 09/08/14   Tatyana Kirichenko, PA-C  cyclobenzaprine (FLEXERIL) 10 MG tablet Take 1 tablet (10 mg total) by mouth 2 (two) times daily as needed for muscle spasms. 06/25/14   Teressa Lower, NP  diazepam (VALIUM) 5 MG tablet Take 1 tablet (5 mg total) by mouth 2 (two) times daily. 04/17/13   Fayrene Helper, PA-C  diphenhydrAMINE (BENADRYL) 25 mg capsule Take 1 capsule (25 mg total) by mouth every 6 (six) hours as needed (itching or swelling). 09/22/15   Chase Picket Ward, PA-C  ibuprofen (ADVIL,MOTRIN) 800 MG tablet Take 1 tablet (800 mg total) by mouth 3 (three) times daily. 06/25/14   Teressa Lower, NP  methocarbamol (ROBAXIN) 500 MG tablet Take 1 tablet (500 mg total) by mouth 2 (two) times daily. 04/21/15   Felicie Morn, NP  metroNIDAZOLE  (FLAGYL) 500 MG tablet Take 1 tablet (500 mg total) by mouth 2 (two) times daily. One po bid x 7 days 11/09/13   Kathrynn Speed, PA-C  naproxen (NAPROSYN) 500 MG tablet Take 1 tablet (500 mg total) by mouth 2 (two) times daily. 04/21/15   Felicie Morn, NP  predniSONE (DELTASONE) 20 MG tablet Take 2 tablets (40 mg total) by mouth tomorrow morning. Then take 1 tablet (20mg  total) by mouth the next morning. 09/22/15   Chase Picket Ward, PA-C    Family History History reviewed. No pertinent family history.  Social History Social History  Substance Use Topics  . Smoking status: Former Smoker    Types: Cigars  . Smokeless tobacco: Never Used  . Alcohol use No     Allergies   Review of patient's allergies indicates no known allergies.   Review of Systems Review of Systems  HENT: Positive for facial swelling. Negative for trouble swallowing.        Negative throat swelling  Eyes: Negative for visual disturbance.  Respiratory: Negative for shortness of breath.   Skin:       +itching     Physical Exam Updated Vital Signs BP 123/81 (BP Location: Right Arm)   Pulse 61   Temp 97.9 F (36.6 C) (Oral)   Resp 14   SpO2 100%   Physical Exam  Constitutional: He appears well-developed and well-nourished.  HENT:  Head: Normocephalic and atraumatic.    Patent airway with no oral or neck swelling.  Eyes: Conjunctivae and EOM are normal. Pupils are equal, round, and reactive to light. Right eye exhibits no discharge. Left eye exhibits no discharge.  Neck: Normal range of motion. Neck supple.  Cardiovascular: Normal rate and regular rhythm.   No murmur heard. Pulmonary/Chest: Effort normal and breath sounds normal. No respiratory distress. He has no wheezes. He has no rales.  Abdominal: Soft. There is no tenderness.  Musculoskeletal: Normal range of motion.  Lymphadenopathy:    He has no cervical adenopathy.  Neurological: He is alert.  Skin: Skin is warm and dry.  Psychiatric: He has  a normal mood and affect.  Nursing note and vitals reviewed.   ED Treatments / Results  DIAGNOSTIC STUDIES:  Oxygen Saturation is 100% on RA, normal by my interpretation.    COORDINATION OF CARE:  6:30 PM Will order prednisone and benadryl. Discussed treatment plan with pt at bedside and pt agreed to plan.  Labs (all labs ordered are listed, but only abnormal results are displayed) Labs Reviewed - No data to display  EKG  EKG Interpretation None       Radiology No results found.  Procedures Procedures (including critical care time)  Medications Ordered in ED Medications  predniSONE (DELTASONE) tablet 60 mg (60 mg Oral Given 09/22/15 1906)  diphenhydrAMINE (BENADRYL) capsule 25 mg (25 mg Oral Given 09/22/15 1906)     Initial Impression / Assessment and Plan / ED Course  I have reviewed the triage vital signs and the nursing notes.  Pertinent labs & imaging results that were available during my care of the patient were reviewed by me and considered in my medical decision making (see chart for details).  Clinical Course   Jeffrey Curry presents to ED for insect bite just PTA. On exam, patient with insect bite and associated swelling of the right maxillary region. Patent airway and his lungs are clear. No complaints of shortness of breath or difficulty swallowing. Will give prednisone and Benadryl then reassess.  7:37 PM - Patient reevaluated and subjectively feels much improved. Objectively, swelling decreased. Patient appears well.  Evaluation does not show pathology that would require ongoing emergent intervention or inpatient treatment. Patient is hemodynamically stable and mentating appropriately. Will dc with 2 days steroids and Benadryl PRN itching. Return precautions discussed and all questions answered.   Final Clinical Impressions(s) / ED Diagnoses   Final diagnoses:  Insect bite  I personally performed the services described in this documentation, which was  scribed in my presence. The recorded information has been reviewed and is accurate.  New Prescriptions New Prescriptions   DIPHENHYDRAMINE (BENADRYL) 25 MG CAPSULE    Take 1 capsule (25 mg total) by mouth every 6 (six) hours as needed (itching or swelling).   PREDNISONE (DELTASONE) 20 MG TABLET    Take 2 tablets (40 mg total) by mouth tomorrow morning. Then take 1 tablet (  total) by mouth the next morning.     [facility] Ward, PA-C 09/22/15 1947    Nelva Nay, MD 09/29/15 [phone]

## 2015-09-22 NOTE — ED Triage Notes (Signed)
Pt reports being stung by bee x 2 this afternoon to right side of face. Reports swelling to right side of face and right eyelid, minimal swelling noted, airway intact. Has not taken any benadryl or other meds pta.

## 2015-09-22 NOTE — ED Notes (Signed)
Patient able to ambulate independently  

## 2016-10-05 ENCOUNTER — Encounter (HOSPITAL_COMMUNITY): Payer: Self-pay

## 2016-10-05 ENCOUNTER — Emergency Department (HOSPITAL_COMMUNITY)
Admission: EM | Admit: 2016-10-05 | Discharge: 2016-10-05 | Discharge status: Against Medical Advice | Payer: Managed Care, Other (non HMO) | Attending: Emergency Medicine | Admitting: Emergency Medicine

## 2016-10-05 DIAGNOSIS — Z5321 Procedure and treatment not carried out due to patient leaving prior to being seen by health care provider: Secondary | ICD-10-CM | POA: Insufficient documentation

## 2016-10-05 LAB — COMPREHENSIVE METABOLIC PANEL
ALBUMIN: 4.3 g/dL (ref 3.5–5.0)
ALT: 12 U/L — ABNORMAL LOW (ref 17–63)
AST: 16 U/L (ref 15–41)
Alkaline Phosphatase: 51 U/L (ref 38–126)
Anion gap: 6 (ref 5–15)
BUN: 8 mg/dL (ref 6–20)
CO2: 25 mmol/L (ref 22–32)
CREATININE: 1.04 mg/dL (ref 0.61–1.24)
Calcium: 8.8 mg/dL — ABNORMAL LOW (ref 8.9–10.3)
Chloride: 109 mmol/L (ref 101–111)
GFR calc Af Amer: >60 mL/min (ref >60)
GFR calc non Af Amer: >60 mL/min (ref >60)
GLUCOSE: 123 mg/dL — AB (ref 65–99)
Potassium: 3.5 mmol/L (ref 3.5–5.1)
SODIUM: 140 mmol/L (ref 135–145)
Total Bilirubin: 1.3 mg/dL — ABNORMAL HIGH (ref 0.3–1.2)
Total Protein: 7.5 g/dL (ref 6.5–8.1)

## 2016-10-05 LAB — CBC
HCT: 38.5 % — ABNORMAL LOW (ref 39.0–52.0)
HEMOGLOBIN: 13.3 g/dL (ref 13.0–17.0)
MCH: 28.1 pg (ref 26.0–34.0)
MCHC: 34.5 g/dL (ref 30.0–36.0)
MCV: 81.2 fL (ref 78.0–100.0)
PLATELETS: 179 10*3/uL (ref 150–400)
RBC: 4.74 MIL/uL (ref 4.22–5.81)
RDW: 13.8 % (ref 11.5–15.5)
WBC: 11.6 10*3/uL — ABNORMAL HIGH (ref 4.0–10.5)

## 2016-10-05 LAB — LIPASE, BLOOD: Lipase: 22 U/L (ref 11–51)

## 2016-10-05 NOTE — ED Triage Notes (Signed)
Per EMS, pt from home.  Pt c/o n/v since 10am.  Pt states chills.  Vitals: 144/90, hr 44-60, resp 16,
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
## 2016-12-16 ENCOUNTER — Encounter (HOSPITAL_COMMUNITY): Payer: Self-pay | Admitting: Emergency Medicine

## 2016-12-16 ENCOUNTER — Emergency Department (HOSPITAL_COMMUNITY)
Admission: EM | Admit: 2016-12-16 | Discharge: 2016-12-16 | Disposition: A | Discharge status: Home / Self Care | Payer: No Typology Code available for payment source | Attending: Emergency Medicine | Admitting: Emergency Medicine

## 2016-12-16 ENCOUNTER — Emergency Department (HOSPITAL_COMMUNITY): Payer: No Typology Code available for payment source

## 2016-12-16 DIAGNOSIS — Y9389 Activity, other specified: Secondary | ICD-10-CM | POA: Insufficient documentation

## 2016-12-16 DIAGNOSIS — Y92410 Unspecified street and highway as the place of occurrence of the external cause: Secondary | ICD-10-CM | POA: Diagnosis not present

## 2016-12-16 DIAGNOSIS — Z87891 Personal history of nicotine dependence: Secondary | ICD-10-CM | POA: Insufficient documentation

## 2016-12-16 DIAGNOSIS — S161XXA Strain of muscle, fascia and tendon at neck level, initial encounter: Secondary | ICD-10-CM | POA: Insufficient documentation

## 2016-12-16 DIAGNOSIS — Z79899 Other long term (current) drug therapy: Secondary | ICD-10-CM | POA: Diagnosis not present

## 2016-12-16 DIAGNOSIS — R51 Headache: Secondary | ICD-10-CM | POA: Insufficient documentation

## 2016-12-16 DIAGNOSIS — Y998 Other external cause status: Secondary | ICD-10-CM | POA: Insufficient documentation

## 2016-12-16 DIAGNOSIS — S199XXA Unspecified injury of neck, initial encounter: Secondary | ICD-10-CM | POA: Diagnosis present

## 2016-12-16 MED ORDER — BACLOFEN 10 MG PO TABS: 10.0000 mg | ORAL_TABLET | Freq: Three times a day (TID) | ORAL | 0 refills | Status: AC

## 2016-12-16 MED ORDER — MELOXICAM 15 MG PO TABS: 15.0000 mg | ORAL_TABLET | Freq: Every day | ORAL | 0 refills | Status: AC
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
## 2016-12-16 NOTE — Discharge Instructions (Signed)
Your ct scan was negative. You can expect soreness and spasm of the muscles over the next several days. Return to the emergency department immediately if you develop any of the following symptoms: You have numbness, tingling, or weakness in the arms or legs. You develop severe headaches not relieved with medicine. You have severe neck pain, especially tenderness in the middle of the back of your neck. You have changes in bowel or bladder control. There is increasing pain in any area of the body. You have shortness of breath, light-headedness, dizziness, or fainting. You have chest pain. You feel sick to your stomach (nauseous), throw up (vomit), or sweat. You have increasing abdominal discomfort. There is blood in your urine, stool, or vomit. You have pain in your shoulder (shoulder strap areas). You feel your symptoms are getting worse.

## 2017-03-29 ENCOUNTER — Other Ambulatory Visit: Payer: Self-pay

## 2017-03-29 ENCOUNTER — Encounter (HOSPITAL_COMMUNITY): Payer: Self-pay | Admitting: *Deleted

## 2017-03-29 ENCOUNTER — Emergency Department (HOSPITAL_COMMUNITY)
Admission: EM | Admit: 2017-03-29 | Discharge: 2017-03-29 | Disposition: A | Discharge status: Home / Self Care | Payer: Self-pay | Attending: Emergency Medicine | Admitting: Emergency Medicine

## 2017-03-29 DIAGNOSIS — Z87891 Personal history of nicotine dependence: Secondary | ICD-10-CM | POA: Insufficient documentation

## 2017-03-29 DIAGNOSIS — R21 Rash and other nonspecific skin eruption: Secondary | ICD-10-CM | POA: Insufficient documentation

## 2017-03-29 DIAGNOSIS — Z79899 Other long term (current) drug therapy: Secondary | ICD-10-CM | POA: Insufficient documentation

## 2017-03-29 MED ORDER — TRIAMCINOLONE ACETONIDE 0.1 % EX CREA: TOPICAL_CREAM | CUTANEOUS | 0 refills | Status: AC

## 2017-03-29 NOTE — ED Provider Notes (Signed)
[facility] Provider Note   CSN: [id number] Arrival date & time: 03/29/17  0935     History   Chief Complaint Chief Complaint  Patient presents with  . Rash    HPI Jeffrey Curry is a 47 y.o. male who presents with complaint of rash x 1 month. Patient states the rash started on the dorsum of the hands and then spread to multiple locations including upper extremities, lower extremities, genitals, and neck. Patient describes the rash as bumps that are itchy. Patient states that rash has resolved in all locations other than the left lateral neck.  States he has been applying OTC cortisone with some improvement.  No other alleviating/aggravating factors. Denies fever, chills, nausea, or vomiting. States he has not tried any new products, environments, or foods. Reports no palm/sole involvement at any time.   HPI  History reviewed. No pertinent past medical history.  There are no active problems to display for this patient.   History reviewed. No pertinent surgical history.     Home Medications    Prior to Admission medications   Medication Sig Start Date End Date Taking? Authorizing Provider  baclofen (LIORESAL) 10 MG tablet Take 1 tablet (10 mg total) by mouth 3 (three) times daily. 12/16/16   Harris, Abigail, PA-C  cephALEXin (KEFLEX) 500 MG capsule Take 1 capsule (500 mg total) by mouth 4 (four) times daily. 09/08/14   Kirichenko, Lemont Fillersatyana, PA-C  cyclobenzaprine (FLEXERIL) 10 MG tablet Take 1 tablet (10 mg total) by mouth 2 (two) times daily as needed for muscle spasms. 06/25/14   Teressa LowerPickering, Vrinda, NP  diazepam (VALIUM) 5 MG tablet Take 1 tablet (5 mg total) by mouth 2 (two) times daily. 04/17/13   Fayrene Helperran, Bowie, PA-C  diphenhydrAMINE (BENADRYL) 25 mg capsule Take 1 capsule (25 mg total) by mouth every 6 (six) hours as needed (itching or swelling). 09/22/15   Ward, Chase PicketJaime Pilcher, PA-C  ibuprofen (ADVIL,MOTRIN) 800 MG tablet Take 1 tablet (800 mg  total) by mouth 3 (three) times daily. 06/25/14   Teressa LowerPickering, Vrinda, NP  meloxicam (MOBIC) 15 MG tablet Take 1 tablet (15 mg total) by mouth daily. 12/16/16   Harris, Cammy CopaAbigail, PA-C  methocarbamol (ROBAXIN) 500 MG tablet Take 1 tablet (500 mg total) by mouth 2 (two) times daily. 04/21/15   Felicie MornSmith, David, NP  metroNIDAZOLE (FLAGYL) 500 MG tablet Take 1 tablet (500 mg total) by mouth 2 (two) times daily. One po bid x 7 days 11/09/13   Hess, Nada Boozerobyn M, PA-C  naproxen (NAPROSYN) 500 MG tablet Take 1 tablet (500 mg total) by mouth 2 (two) times daily. 04/21/15   Felicie MornSmith, David, NP  predniSONE (DELTASONE) 20 MG tablet Take 2 tablets (40 mg total) by mouth tomorrow morning. Then take 1 tablet (20mg  total) by mouth the next morning. 09/22/15   Ward, Chase PicketJaime Pilcher, PA-C    Family History History reviewed. No pertinent family history.  Social History Social History   Tobacco Use  . Smoking status: Former Smoker    Types: Cigars  . Smokeless tobacco: Never Used  Substance Use Topics  . Alcohol use: No  . Drug use: No     Allergies   Patient has no known allergies.   Review of Systems Review of Systems  Constitutional: Negative for chills and fever.  HENT: Negative for trouble swallowing.   Respiratory: Negative for shortness of breath.   Gastrointestinal: Negative for nausea and vomiting.  Skin: Positive for rash.  Physical Exam Updated Vital Signs BP (!) 148/93 (BP Location: Right Arm)   Pulse 83   Temp 98.1 F (36.7 C) (Oral)   Resp 18   SpO2 100%   Physical Exam  Constitutional: He appears well-developed and well-nourished. No distress.  HENT:  Head: Normocephalic and atraumatic.  Mouth/Throat: Uvula is midline and oropharynx is clear and moist.  Tolerating secretions without difficulty. Airway is patent.   Eyes: Conjunctivae are normal. Right eye exhibits no discharge. Left eye exhibits no discharge.  Neck: Normal range of motion. Neck supple.  Pulmonary/Chest: Effort normal.  No respiratory distress.  Lymphadenopathy:    He has no cervical adenopathy.  Neurological: He is alert.  Clear speech.   Skin:  There is a 6 cm x 2 cm area of mildly erythematous papules to the base of the left lateral neck. There is no appreciable warmth, fluctuance, or induration. No vesicular lesions. No blisters. No tenderness to palpation. Rash is not noted in any other regions, no palm/sole involvement, patient declined genital exam.  Psychiatric: He has a normal mood and affect. His behavior is normal. Thought content normal.  Nursing note and vitals reviewed.   ED Treatments / Results  Labs (all labs ordered are listed, but only abnormal results are displayed) Labs Reviewed - No data to display  EKG  EKG Interpretation None      Radiology No results found.  Procedures Procedures (including critical care time)  Medications Ordered in ED Medications - No data to display   Initial Impression / Assessment and Plan / ED Course  I have reviewed the triage vital signs and the nursing notes.  Pertinent labs & imaging results that were available during my care of the patient were reviewed by me and considered in my medical decision making (see chart for details).    Patient presents with rash. Patient is nontoxic appearing, vitals are WNL other than BP elevated- no indication of HTN emergency, discussed need for recheck with patient.  Patient denies any difficulty breathing or swallowing, he has a patent airway without stridor and is handling secretions without difficulty; no angioedema. No blisters, no pustules, no vesicles, no overlying warmth, no draining sinus tracts, no superficial abscesses, no bullous impetigo, no desquamation, no target lesions with dusky purpura or a central bulla. Not tender to touch. No concern for superimposed infection. No concern for SJS, TEN, TSS, tick borne illness, syphilis or other life-threatening condition. Unclear definitive etiology of rash  at this time. Will treat with triamcinolone cream and benadryl PRN for pruritus with PCP follow up. I discussed treatment plan, need for PCP follow-up, and return precautions with the patient. Provided opportunity for questions, patient confirmed understanding and is in agreement with plan.   Final Clinical Impressions(s) / ED Diagnoses   Final diagnoses:  Rash    ED Discharge Orders        Ordered    triamcinolone cream (KENALOG) 0.1 %     03/29/17 1011       Colbie Sliker, [place], PA-C 03/29/17 1017    Terrilee Files, MD 03/30/17 [phone]

## 2017-03-29 NOTE — Discharge Instructions (Signed)
You were seen in the emergency department for a rash. You were given a prescription for Triamcinolone- this is a topical steroid cream. Apply this to the affected area 2-3 times per day. Take Benadryl as needed for itching.   Follow up with primary care in 1 week for re-evaluation and for recheck of your blood pressure as this was high in the emergency department today. Return to the emergency department for new or worsening symptoms or other concerns you may have.

## 2017-03-29 NOTE — ED Triage Notes (Signed)
Pt reports rash and itching to his neck for several days. Had one spot on his arm. Denies hx of eczema.

## 2017-06-14 ENCOUNTER — Other Ambulatory Visit: Payer: Self-pay

## 2017-06-14 ENCOUNTER — Encounter (HOSPITAL_COMMUNITY): Payer: Self-pay | Admitting: Emergency Medicine

## 2017-06-14 ENCOUNTER — Emergency Department (HOSPITAL_COMMUNITY)
Admission: EM | Admit: 2017-06-14 | Discharge: 2017-06-14 | Discharge status: Against Medical Advice | Payer: Self-pay | Attending: Emergency Medicine | Admitting: Emergency Medicine

## 2017-06-14 DIAGNOSIS — Z5321 Procedure and treatment not carried out due to patient leaving prior to being seen by health care provider: Secondary | ICD-10-CM | POA: Insufficient documentation

## 2017-06-14 LAB — COMPREHENSIVE METABOLIC PANEL
ALK PHOS: 53 U/L (ref 38–126)
ALT: 17 U/L (ref 17–63)
ANION GAP: 11 (ref 5–15)
AST: 20 U/L (ref 15–41)
Albumin: 4.2 g/dL (ref 3.5–5.0)
BILIRUBIN TOTAL: 1.4 mg/dL — AB (ref 0.3–1.2)
BUN: 8 mg/dL (ref 6–20)
CALCIUM: 9.3 mg/dL (ref 8.9–10.3)
CO2: 21 mmol/L — ABNORMAL LOW (ref 22–32)
Chloride: 108 mmol/L (ref 101–111)
Creatinine, Ser: 0.98 mg/dL (ref 0.61–1.24)
GLUCOSE: 140 mg/dL — AB (ref 65–99)
Potassium: 4 mmol/L (ref 3.5–5.1)
Sodium: 140 mmol/L (ref 135–145)
TOTAL PROTEIN: 7.3 g/dL (ref 6.5–8.1)

## 2017-06-14 LAB — CBC
HCT: 40.6 % (ref 39.0–52.0)
HEMOGLOBIN: 14.3 g/dL (ref 13.0–17.0)
MCH: 29.5 pg (ref 26.0–34.0)
MCHC: 35.2 g/dL (ref 30.0–36.0)
MCV: 83.7 fL (ref 78.0–100.0)
Platelets: 218 10*3/uL (ref 150–400)
RBC: 4.85 MIL/uL (ref 4.22–5.81)
RDW: 14.3 % (ref 11.5–15.5)
WBC: 11.9 10*3/uL — AB (ref 4.0–10.5)

## 2017-06-14 LAB — URINALYSIS, ROUTINE W REFLEX MICROSCOPIC
Bacteria, UA: NONE SEEN
Bilirubin Urine: NEGATIVE
GLUCOSE, UA: NEGATIVE mg/dL
Ketones, ur: 20 mg/dL — AB
Leukocytes, UA: NEGATIVE
NITRITE: NEGATIVE
PROTEIN: 30 mg/dL — AB
SPECIFIC GRAVITY, URINE: 1.011 (ref 1.005–1.030)
Squamous Epithelial / LPF: NONE SEEN
pH: 8 (ref 5.0–8.0)

## 2017-06-14 LAB — LIPASE, BLOOD: Lipase: 20 U/L (ref 11–51)

## 2017-06-14 MED ORDER — ONDANSETRON 4 MG PO TBDP: 4.0000 mg | ORAL_TABLET | Freq: Once | ORAL | Status: DC | PRN

## 2017-06-14 NOTE — ED Notes (Signed)
Pt walks to triage with family member stating is going to leave. Pt informed of risks of leaving without evaluation. Pt signed AMA.

## 2017-06-14 NOTE — ED Triage Notes (Signed)
Per EMS pt complaint of abdominal pain with n/v/d onset 0400.

## 2018-02-07 ENCOUNTER — Other Ambulatory Visit: Payer: Self-pay

## 2018-02-07 ENCOUNTER — Encounter (HOSPITAL_COMMUNITY): Payer: Self-pay

## 2018-02-07 ENCOUNTER — Emergency Department (HOSPITAL_COMMUNITY)
Admission: EM | Admit: 2018-02-07 | Discharge: 2018-02-07 | Disposition: A | Discharge status: Home / Self Care | Payer: Self-pay | Attending: Emergency Medicine | Admitting: Emergency Medicine

## 2018-02-07 DIAGNOSIS — Z202 Contact with and (suspected) exposure to infections with a predominantly sexual mode of transmission: Secondary | ICD-10-CM | POA: Insufficient documentation

## 2018-02-07 DIAGNOSIS — Z87891 Personal history of nicotine dependence: Secondary | ICD-10-CM | POA: Insufficient documentation

## 2018-02-07 DIAGNOSIS — Z79899 Other long term (current) drug therapy: Secondary | ICD-10-CM | POA: Insufficient documentation

## 2018-02-07 LAB — URINALYSIS, ROUTINE W REFLEX MICROSCOPIC
Bilirubin Urine: NEGATIVE
GLUCOSE, UA: NEGATIVE mg/dL
HGB URINE DIPSTICK: NEGATIVE
Ketones, ur: NEGATIVE mg/dL
LEUKOCYTES UA: NEGATIVE
Nitrite: NEGATIVE
PH: 6 (ref 5.0–8.0)
PROTEIN: NEGATIVE mg/dL
SPECIFIC GRAVITY, URINE: 1.009 (ref 1.005–1.030)

## 2018-02-07 MED ORDER — AZITHROMYCIN 250 MG PO TABS
1000.0000 mg | ORAL_TABLET | Freq: Once | ORAL | Status: AC
  Administered 2018-02-07: 1000 mg via ORAL
  Filled 2018-02-07: qty 4

## 2018-02-07 MED ORDER — LIDOCAINE HCL (PF) 1 % IJ SOLN
INTRAMUSCULAR | Status: AC
  Administered 2018-02-07: 1.9 mL
  Filled 2018-02-07: qty 5

## 2018-02-07 MED ORDER — METRONIDAZOLE 500 MG PO TABS
2000.0000 mg | ORAL_TABLET | Freq: Once | ORAL | Status: AC
  Administered 2018-02-07: 2000 mg via ORAL
  Filled 2018-02-07: qty 4

## 2018-02-07 MED ORDER — CEFTRIAXONE SODIUM 250 MG IJ SOLR
250.0000 mg | Freq: Once | INTRAMUSCULAR | Status: AC
  Administered 2018-02-07: 250 mg via INTRAMUSCULAR
  Filled 2018-02-07: qty 250

## 2018-02-07 NOTE — ED Notes (Signed)
Pt given sprite to drink. 

## 2018-02-07 NOTE — ED Triage Notes (Signed)
Pt states his gf tested + for trichomoniasis ; pt states " my privates itch at times "

## 2018-02-07 NOTE — ED Provider Notes (Signed)
[facility]   CSN: [id number] Arrival date & time: 02/07/18  1415     History   Chief Complaint Chief Complaint  Patient presents with  . SEXUALLY TRANSMITTED DISEASE    HPI Jeffrey Curry is a 47 y.o. male.  He is here for evaluation of STD.  Says his girlfriend tested positive for trichomonas and he has noticed a little bit of clear discharge from the tip of his penis.  No fevers or chills no nausea no vomiting.  No open sores.  He has had an STD before.  No other medical complaints.  The history is provided by the patient.  Male GU Problem  Primary symptoms include genital itching, penile discharge.  Primary symptoms include no dysuria. This is a new problem. The problem has not changed since onset.The discharge is clear. Pertinent negatives include no nausea, no vomiting, no abdominal pain and no frequency. There has been no fever. He has tried nothing for the symptoms. Sexual activity: sexually active. Partner displays symptoms of an STD: yes.    History reviewed. No pertinent past medical history.  There are no active problems to display for this patient.   History reviewed. No pertinent surgical history.      Home Medications    Prior to Admission medications   Medication Sig Start Date End Date Taking? Authorizing Provider  baclofen (LIORESAL) 10 MG tablet Take 1 tablet (10 mg total) by mouth 3 (three) times daily. 12/16/16   Harris, Abigail, PA-C  cephALEXin (KEFLEX) 500 MG capsule Take 1 capsule (500 mg total) by mouth 4 (four) times daily. 09/08/14   Kirichenko, Lemont Fillersatyana, PA-C  cyclobenzaprine (FLEXERIL) 10 MG tablet Take 1 tablet (10 mg total) by mouth 2 (two) times daily as needed for muscle spasms. 06/25/14   Teressa LowerPickering, Vrinda, NP  diazepam (VALIUM) 5 MG tablet Take 1 tablet (5 mg total) by mouth 2 (two) times daily. 04/17/13   Fayrene Helperran, Bowie, PA-C  diphenhydrAMINE (BENADRYL) 25 mg capsule Take 1 capsule (25 mg  total) by mouth every 6 (six) hours as needed (itching or swelling). 09/22/15   Ward, Chase PicketJaime Pilcher, PA-C  ibuprofen (ADVIL,MOTRIN) 800 MG tablet Take 1 tablet (800 mg total) by mouth 3 (three) times daily. 06/25/14   Teressa LowerPickering, Vrinda, NP  meloxicam (MOBIC) 15 MG tablet Take 1 tablet (15 mg total) by mouth daily. 12/16/16   Harris, Cammy CopaAbigail, PA-C  methocarbamol (ROBAXIN) 500 MG tablet Take 1 tablet (500 mg total) by mouth 2 (two) times daily. 04/21/15   Felicie MornSmith, David, NP  metroNIDAZOLE (FLAGYL) 500 MG tablet Take 1 tablet (500 mg total) by mouth 2 (two) times daily. One po bid x 7 days 11/09/13   Hess, Nada Boozerobyn M, PA-C  naproxen (NAPROSYN) 500 MG tablet Take 1 tablet (500 mg total) by mouth 2 (two) times daily. 04/21/15   Felicie MornSmith, David, NP  predniSONE (DELTASONE) 20 MG tablet Take 2 tablets (40 mg total) by mouth tomorrow morning. Then take 1 tablet (20mg  total) by mouth the next morning. 09/22/15   Ward, Chase PicketJaime Pilcher, PA-C  triamcinolone cream (KENALOG) 0.1 % Apply a thin layer to affected areas 2-3 times per day. 03/29/17   Petrucelli, Pleas KochSamantha R, PA-C    Family History No family history on file.  Social History Social History   Tobacco Use  . Smoking status: Former Smoker    Types: Cigars  . Smokeless tobacco: Never Used  Substance Use Topics  . Alcohol use: No  .  Drug use: Yes    Types: Marijuana     Allergies   Patient has no known allergies.   Review of Systems Review of Systems  Constitutional: Negative for fever.  HENT: Negative for sore throat.   Respiratory: Negative for shortness of breath.   Cardiovascular: Negative for chest pain.  Gastrointestinal: Negative for abdominal pain, nausea and vomiting.  Genitourinary: Positive for discharge and penile discharge. Negative for dysuria and frequency.  Musculoskeletal: Negative for back pain.  Skin: Negative for rash.     Physical Exam Updated Vital Signs BP 125/80 (BP Location: Right Arm)   Pulse 90   Temp 97.8 F (36.6 C)  (Oral)   Resp 12   Ht 6\' 4"  (1.93 m)   Wt 83.9 kg   SpO2 100%   BMI 22.52 kg/m   Physical Exam Vitals signs and nursing note reviewed.  Constitutional:      Appearance: He is well-developed.  HENT:     Head: Normocephalic and atraumatic.  Eyes:     Conjunctiva/sclera: Conjunctivae normal.  Neck:     Musculoskeletal: Neck supple.  Pulmonary:     Effort: Pulmonary effort is normal.  Genitourinary:    Penis: Normal and circumcised. No discharge or lesions.      Scrotum/Testes: Normal.  Skin:    General: Skin is warm and dry.  Neurological:     Mental Status: He is alert.     GCS: GCS eye subscore is 4. GCS verbal subscore is 5. GCS motor subscore is 6.      ED Treatments / Results  Labs (all labs ordered are listed, but only abnormal results are displayed) Labs Reviewed  RPR  HIV ANTIBODY (ROUTINE TESTING W REFLEX)  URINALYSIS, ROUTINE W REFLEX MICROSCOPIC  GC/CHLAMYDIA PROBE AMP ([place]) NOT AT [facility]    EKG None  Radiology No results found.  Procedures Procedures (including critical care time)  Medications Ordered in ED Medications  cefTRIAXone (ROCEPHIN) injection 250 mg (has no administration in time range)  azithromycin (ZITHROMAX) tablet 1,000 mg (has no administration in time range)  metroNIDAZOLE (FLAGYL) tablet 2,000 mg (has no administration in time range)     Initial Impression / Assessment and Plan / ED Course  I have reviewed the triage vital signs and the nursing notes.  Pertinent labs & imaging results that were available during my care of the patient were reviewed by me and considered in my medical decision making (see chart for details).       Final Clinical Impressions(s) / ED Diagnoses   Final diagnoses:  STD exposure    ED Discharge Orders    None       Terrilee Files, MD 02/08/18 [phone]

## 2018-02-07 NOTE — Discharge Instructions (Signed)
You were evaluated in the emergency department for concerns of possible STD exposure.  You were tested for HIV, syphilis, gonorrhea, chlamydia and trichomonas.  We treated you with antibiotics for possible gonorrhea chlamydia and trichomonas infections.  It will be important for you to practice safe sex with condoms.  Please return if any worsening symptoms.

## 2018-02-08 LAB — RPR: RPR Ser Ql: NONREACTIVE

## 2018-02-08 LAB — HIV ANTIBODY (ROUTINE TESTING W REFLEX): HIV Screen 4th Generation wRfx: NONREACTIVE

## 2018-02-10 LAB — GC/CHLAMYDIA PROBE AMP (~~LOC~~) NOT AT ARMC
Chlamydia: NEGATIVE
NEISSERIA GONORRHEA: NEGATIVE
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
## 2020-02-21 ENCOUNTER — Other Ambulatory Visit: Payer: Self-pay

## 2020-02-21 ENCOUNTER — Encounter (HOSPITAL_COMMUNITY): Payer: Self-pay | Admitting: Emergency Medicine

## 2020-02-21 ENCOUNTER — Emergency Department (HOSPITAL_COMMUNITY)
Admission: EM | Admit: 2020-02-21 | Discharge: 2020-02-21 | Disposition: A | Discharge status: Home / Self Care | Payer: Self-pay | Attending: Emergency Medicine | Admitting: Emergency Medicine

## 2020-02-21 DIAGNOSIS — R300 Dysuria: Secondary | ICD-10-CM | POA: Insufficient documentation

## 2020-02-21 DIAGNOSIS — Z87891 Personal history of nicotine dependence: Secondary | ICD-10-CM | POA: Insufficient documentation

## 2020-02-21 DIAGNOSIS — I10 Essential (primary) hypertension: Secondary | ICD-10-CM | POA: Insufficient documentation

## 2020-02-21 DIAGNOSIS — Z113 Encounter for screening for infections with a predominantly sexual mode of transmission: Secondary | ICD-10-CM | POA: Insufficient documentation

## 2020-02-21 DIAGNOSIS — R369 Urethral discharge, unspecified: Secondary | ICD-10-CM | POA: Insufficient documentation

## 2020-02-21 LAB — URINALYSIS, COMPLETE (UACMP) WITH MICROSCOPIC
Bacteria, UA: NONE SEEN
Bilirubin Urine: NEGATIVE
Glucose, UA: NEGATIVE mg/dL
Hgb urine dipstick: NEGATIVE
Ketones, ur: NEGATIVE mg/dL
Nitrite: NEGATIVE
Protein, ur: NEGATIVE mg/dL
Specific Gravity, Urine: 1.011 (ref 1.005–1.030)
pH: 7 (ref 5.0–8.0)

## 2020-02-21 MED ORDER — DOXYCYCLINE HYCLATE 100 MG PO CAPS: 100.0000 mg | ORAL_CAPSULE | Freq: Two times a day (BID) | ORAL | 0 refills | Status: AC

## 2020-02-21 MED ORDER — LIDOCAINE HCL (PF) 1 % IJ SOLN
INTRAMUSCULAR | Status: AC
  Administered 2020-02-21: 1 mL
  Filled 2020-02-21: qty 5

## 2020-02-21 MED ORDER — CEFTRIAXONE SODIUM 500 MG IJ SOLR
500.0000 mg | Freq: Once | INTRAMUSCULAR | Status: AC
  Administered 2020-02-21: 500 mg via INTRAMUSCULAR
  Filled 2020-02-21: qty 500
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
## 2020-02-21 NOTE — Discharge Instructions (Addendum)
You were evaluated in the emergency department today for your penile discharge.  Your physical exam and vital signs are reassuring.    Your urine was tested for signs of infection or STD trichomonas.  There is no evidence of either of these on today's exams.  Additionally you were tested for gonorrhea and chlamydia; these results will return in approximately 72 hours.  After discussion in the emergency department, you expressed preference to proceed with presumptive treatment for both gonorrhea and chlamydia at this time.  You were administered an injection of antibiotics while in the emergency department to treat you for gonorrhea.  You have been prescribed 10 days of antibiotics outpatient to treat for possible chlamydia.  Please do not share your antibiotics with anyone else.  Please take your antibiotic for the entire course.    Return emergency department if you develop severe pain, skin changes to your penis, any nausea or vomiting that does not stop, or any other new severe symptoms.  You may present to the health department at any time for further testing for sexually transmitted infection, including HIV and syphilis, which are tested for by blood sample.

## 2020-02-22 LAB — GC/CHLAMYDIA PROBE AMP (~~LOC~~) NOT AT ARMC
Chlamydia: NEGATIVE
Comment: NEGATIVE
Comment: NORMAL
Neisseria Gonorrhea: NEGATIVE

## 2020-07-14 ENCOUNTER — Encounter (HOSPITAL_COMMUNITY): Payer: Self-pay | Admitting: Emergency Medicine

## 2020-07-14 ENCOUNTER — Emergency Department (HOSPITAL_COMMUNITY)
Admission: EM | Admit: 2020-07-14 | Discharge: 2020-07-14 | Disposition: A | Discharge status: Home / Self Care | Payer: Medicaid Other | Attending: Physician Assistant | Admitting: Physician Assistant

## 2020-07-14 ENCOUNTER — Other Ambulatory Visit: Payer: Self-pay

## 2020-07-14 DIAGNOSIS — Z202 Contact with and (suspected) exposure to infections with a predominantly sexual mode of transmission: Secondary | ICD-10-CM | POA: Insufficient documentation

## 2020-07-14 DIAGNOSIS — Z87891 Personal history of nicotine dependence: Secondary | ICD-10-CM | POA: Insufficient documentation

## 2020-07-14 MED ORDER — METRONIDAZOLE 500 MG PO TABS
2000.0000 mg | ORAL_TABLET | Freq: Once | ORAL | Status: AC
Start: 2020-07-14 — End: 2020-07-14
  Administered 2020-07-14: 2000 mg via ORAL
  Filled 2020-07-14: qty 4
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
## 2020-07-14 NOTE — Discharge Instructions (Addendum)
You were treated for trichomonas today.  Please return to the ER for any new or worsening symptoms.

## 2021-03-19 ENCOUNTER — Encounter (HOSPITAL_COMMUNITY): Payer: Self-pay | Admitting: Emergency Medicine

## 2021-03-19 ENCOUNTER — Emergency Department (HOSPITAL_COMMUNITY)
Admission: EM | Admit: 2021-03-19 | Discharge: 2021-03-19 | Disposition: A | Discharge status: Home / Self Care | Payer: Worker's Compensation | Attending: Emergency Medicine | Admitting: Emergency Medicine

## 2021-03-19 ENCOUNTER — Other Ambulatory Visit: Payer: Self-pay

## 2021-03-19 DIAGNOSIS — Y30XXXA Falling, jumping or pushed from a high place, undetermined intent, initial encounter: Secondary | ICD-10-CM | POA: Diagnosis not present

## 2021-03-19 DIAGNOSIS — S0181XA Laceration without foreign body of other part of head, initial encounter: Secondary | ICD-10-CM

## 2021-03-19 DIAGNOSIS — S01112A Laceration without foreign body of left eyelid and periocular area, initial encounter: Secondary | ICD-10-CM | POA: Diagnosis not present

## 2021-03-19 DIAGNOSIS — S0592XA Unspecified injury of left eye and orbit, initial encounter: Secondary | ICD-10-CM | POA: Diagnosis present

## 2021-03-19 DIAGNOSIS — Z23 Encounter for immunization: Secondary | ICD-10-CM | POA: Diagnosis not present

## 2021-03-19 MED ORDER — BACITRACIN ZINC 500 UNIT/GM EX OINT
TOPICAL_OINTMENT | Freq: Two times a day (BID) | CUTANEOUS | Status: DC
  Filled 2021-03-19: qty 0.9

## 2021-03-19 MED ORDER — TETANUS-DIPHTH-ACELL PERTUSSIS 5-2.5-18.5 LF-MCG/0.5 IM SUSY
0.5000 mL | PREFILLED_SYRINGE | Freq: Once | INTRAMUSCULAR | Status: AC
  Administered 2021-03-19: 0.5 mL via INTRAMUSCULAR
  Filled 2021-03-19: qty 0.5

## 2021-03-19 MED ORDER — LIDOCAINE HCL (PF) 1 % IJ SOLN
INTRAMUSCULAR | Status: AC
  Filled 2021-03-19: qty 5

## 2021-03-19 NOTE — Discharge Instructions (Signed)
Keep the wound clean with soap and water daily.  Apply an antibiotic ointment or Vaseline to the wound to help it heal.  Have the sutures removed in 5 days by going to an urgent care or return here.
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
## 2021-03-30 ENCOUNTER — Emergency Department (HOSPITAL_COMMUNITY)
Admission: EM | Admit: 2021-03-30 | Discharge: 2021-03-30 | Disposition: A | Discharge status: Home / Self Care | Payer: Worker's Compensation | Attending: Emergency Medicine | Admitting: Emergency Medicine

## 2021-03-30 DIAGNOSIS — S0592XD Unspecified injury of left eye and orbit, subsequent encounter: Secondary | ICD-10-CM | POA: Diagnosis present

## 2021-03-30 DIAGNOSIS — S01112D Laceration without foreign body of left eyelid and periocular area, subsequent encounter: Secondary | ICD-10-CM | POA: Diagnosis not present

## 2021-03-30 DIAGNOSIS — Z4802 Encounter for removal of sutures: Secondary | ICD-10-CM | POA: Insufficient documentation

## 2021-03-30 DIAGNOSIS — X58XXXD Exposure to other specified factors, subsequent encounter: Secondary | ICD-10-CM | POA: Insufficient documentation

## 2021-03-30 MED ORDER — BACITRACIN ZINC 500 UNIT/GM EX OINT
TOPICAL_OINTMENT | Freq: Two times a day (BID) | CUTANEOUS | Status: DC
  Filled 2021-03-30: qty 0.9

## 2021-03-30 NOTE — ED Triage Notes (Signed)
Pt. Stated, here to get my stitches out been there for 10 days . My eyes are not tearing up like thy normally do. Sutures above left eye.

## 2021-03-30 NOTE — Discharge Instructions (Signed)
At this time there does not appear to be the presence of an emergent medical condition, however there is always the potential for conditions to change. Please read and follow the below instructions.  Please return to the Emergency Department immediately for any new or worsening symptoms. Please be sure to follow up with your Primary Care Provider within one week regarding your visit today; please call their office to schedule an appointment even if you are feeling better for a follow-up visit. You may call the ophthalmologist Dr. Phineas Inches for further evaluation of your dry eye sensation.  If you develop any pain in your eye or vision changes please return immediately to the ER.  Go to the nearest Emergency Department immediately if: You have fever or chills You have redness, swelling, or pain around your wound. You have fluid or blood coming from your wound. You have new warmth, a rash, or hardness at the wound site. You have pus or a bad smell coming from your wound. Your wound opens up. Your vision suddenly changes. You have eye pain. You have pus-like fluid coming from your eye. You have any new/concerning or worsening of symptoms.   Please read the additional information packets attached to your discharge summary.  Do not take your medicine if  develop an itchy rash, swelling in your mouth or lips, or difficulty breathing; call 911 and seek immediate emergency medical attention if this occurs.  You may review your lab tests and imaging results in their entirety on your MyChart account.  Please discuss all results of fully with your primary care provider and other specialist at your follow-up visit.  Note: Portions of this text may have been transcribed using voice recognition software. Every effort was made to ensure accuracy; however, inadvertent computerized transcription errors may still be present.
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
## 2021-10-09 ENCOUNTER — Other Ambulatory Visit: Payer: Self-pay

## 2021-10-09 ENCOUNTER — Emergency Department (HOSPITAL_COMMUNITY)
Admission: EM | Admit: 2021-10-09 | Discharge: 2021-10-09 | Disposition: A | Discharge status: Home / Self Care | Payer: Self-pay | Attending: Emergency Medicine | Admitting: Emergency Medicine

## 2021-10-09 ENCOUNTER — Encounter (HOSPITAL_COMMUNITY): Payer: Self-pay

## 2021-10-09 DIAGNOSIS — N341 Nonspecific urethritis: Secondary | ICD-10-CM | POA: Insufficient documentation

## 2021-10-09 DIAGNOSIS — A64 Unspecified sexually transmitted disease: Secondary | ICD-10-CM

## 2021-10-09 DIAGNOSIS — R369 Urethral discharge, unspecified: Secondary | ICD-10-CM | POA: Insufficient documentation

## 2021-10-09 DIAGNOSIS — N342 Other urethritis: Secondary | ICD-10-CM

## 2021-10-09 DIAGNOSIS — Z7251 High risk heterosexual behavior: Secondary | ICD-10-CM

## 2021-10-09 LAB — URINALYSIS, ROUTINE W REFLEX MICROSCOPIC
Bilirubin Urine: NEGATIVE
Glucose, UA: NEGATIVE mg/dL
Hgb urine dipstick: NEGATIVE
Ketones, ur: NEGATIVE mg/dL
Nitrite: NEGATIVE
Protein, ur: NEGATIVE mg/dL
Specific Gravity, Urine: 1.011 (ref 1.005–1.030)
WBC, UA: >50 WBC/hpf — ABNORMAL HIGH (ref 0–5)
pH: 6 (ref 5.0–8.0)

## 2021-10-09 MED ORDER — DOXYCYCLINE HYCLATE 100 MG PO TABS
100.0000 mg | ORAL_TABLET | Freq: Once | ORAL | Status: AC
  Administered 2021-10-09: 100 mg via ORAL
  Filled 2021-10-09: qty 1

## 2021-10-09 MED ORDER — LIDOCAINE HCL (PF) 1 % IJ SOLN
INTRAMUSCULAR | Status: AC
  Administered 2021-10-09: 1 mL
  Filled 2021-10-09: qty 5

## 2021-10-09 MED ORDER — CEFTRIAXONE SODIUM 500 MG IJ SOLR
500.0000 mg | Freq: Once | INTRAMUSCULAR | Status: AC
  Administered 2021-10-09: 500 mg via INTRAMUSCULAR
  Filled 2021-10-09: qty 500

## 2021-10-09 MED ORDER — DOXYCYCLINE HYCLATE 50 MG PO CAPS: 100.0000 mg | ORAL_CAPSULE | Freq: Two times a day (BID) | ORAL | 0 refills | Status: AC

## 2021-10-09 NOTE — ED Provider Notes (Signed)
I saw and evaluated the patient, reviewed the resident's note and I agree with the findings and plan.   51 year old male presents with white penile discharge x1 day.  His urinalysis has increased white blood cells.  Concern for STD exposure.  Likely gonorrhea.  We will treat   Lorre Nick, MD 10/09/21 [id number]

## 2021-10-09 NOTE — ED Notes (Signed)
Called lab to check on status of GC Chlamydia d/t it not showing in process. Per cytology they will obtain the UA from micro and add the GC Chlamydia test to the urine.
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
## 2021-10-09 NOTE — Discharge Instructions (Addendum)
Please pick up your antibiotics at the [facility] at [address] Call the [facility] Division of Public Health if you think you may have a STI. To make an appointment in either the [facility] or [facility] clinic locations, please call [phone]. The cost is free at both sites and all services are confidential. If you have questions about the test, call [phone].

## 2021-10-09 NOTE — ED Notes (Signed)
Discharge instructions reviewed with patient. Follow-up care and medications reviewed. Patient verbalized understanding. Patient A&Ox4, VSS, and ambulatory with steady gait upon discharge.  

## 2021-10-09 NOTE — ED Triage Notes (Signed)
Pt reports white penile discharge that started this morning, denies painful urination.

## 2021-10-10 LAB — GC/CHLAMYDIA PROBE AMP (~~LOC~~) NOT AT ARMC
Chlamydia: NEGATIVE
Comment: NEGATIVE
Comment: NORMAL
Neisseria Gonorrhea: POSITIVE — AB

## 2022-12-03 ENCOUNTER — Ambulatory Visit: Payer: BLUE CROSS/BLUE SHIELD | Admitting: Podiatry
